# Patient Record
Sex: Female | Born: 1965 | Race: White | Hispanic: No | Marital: Single | Smoking: Never smoker
Health system: Southern US, Community
[De-identification: ages and names within clinical notes are randomized; demographics above are authoritative.]

## PROBLEM LIST (undated history)

## (undated) DIAGNOSIS — F319 Bipolar disorder, unspecified: Secondary | ICD-10-CM

## (undated) DIAGNOSIS — E079 Disorder of thyroid, unspecified: Secondary | ICD-10-CM

## (undated) DIAGNOSIS — E119 Type 2 diabetes mellitus without complications: Secondary | ICD-10-CM

## (undated) DIAGNOSIS — IMO0002 Reserved for concepts with insufficient information to code with codable children: Secondary | ICD-10-CM

## (undated) DIAGNOSIS — R197 Diarrhea, unspecified: Secondary | ICD-10-CM

---

## 2007-08-11 ENCOUNTER — Emergency Department (HOSPITAL_COMMUNITY): Admission: EM | Admit: 2007-08-11 | Discharge: 2007-08-11 | Payer: Self-pay | Admitting: Emergency Medicine

## 2010-10-17 LAB — URINALYSIS, ROUTINE W REFLEX MICROSCOPIC
Nitrite: NEGATIVE
Specific Gravity, Urine: 1.024
Urobilinogen, UA: 0.2
pH: 5.5

## 2010-10-17 LAB — URINE MICROSCOPIC-ADD ON

## 2010-10-17 LAB — PREGNANCY, URINE: Preg Test, Ur: NEGATIVE

## 2013-10-08 ENCOUNTER — Emergency Department (HOSPITAL_BASED_OUTPATIENT_CLINIC_OR_DEPARTMENT_OTHER): Payer: Medicare (Managed Care)

## 2013-10-08 ENCOUNTER — Encounter (HOSPITAL_BASED_OUTPATIENT_CLINIC_OR_DEPARTMENT_OTHER): Payer: Self-pay | Admitting: Emergency Medicine

## 2013-10-08 ENCOUNTER — Emergency Department (HOSPITAL_BASED_OUTPATIENT_CLINIC_OR_DEPARTMENT_OTHER)
Admission: EM | Admit: 2013-10-08 | Discharge: 2013-10-08 | Disposition: A | Discharge status: Home / Self Care | Payer: Medicare (Managed Care) | Attending: Emergency Medicine | Admitting: Emergency Medicine

## 2013-10-08 DIAGNOSIS — F319 Bipolar disorder, unspecified: Secondary | ICD-10-CM | POA: Insufficient documentation

## 2013-10-08 DIAGNOSIS — R1011 Right upper quadrant pain: Secondary | ICD-10-CM | POA: Diagnosis not present

## 2013-10-08 DIAGNOSIS — K5289 Other specified noninfective gastroenteritis and colitis: Secondary | ICD-10-CM | POA: Diagnosis not present

## 2013-10-08 DIAGNOSIS — E079 Disorder of thyroid, unspecified: Secondary | ICD-10-CM | POA: Insufficient documentation

## 2013-10-08 DIAGNOSIS — R112 Nausea with vomiting, unspecified: Secondary | ICD-10-CM | POA: Diagnosis not present

## 2013-10-08 DIAGNOSIS — R42 Dizziness and giddiness: Secondary | ICD-10-CM | POA: Diagnosis not present

## 2013-10-08 DIAGNOSIS — E119 Type 2 diabetes mellitus without complications: Secondary | ICD-10-CM | POA: Insufficient documentation

## 2013-10-08 DIAGNOSIS — Z79899 Other long term (current) drug therapy: Secondary | ICD-10-CM | POA: Diagnosis not present

## 2013-10-08 DIAGNOSIS — K529 Noninfective gastroenteritis and colitis, unspecified: Secondary | ICD-10-CM

## 2013-10-08 DIAGNOSIS — R197 Diarrhea, unspecified: Secondary | ICD-10-CM | POA: Diagnosis present

## 2013-10-08 DIAGNOSIS — Z8669 Personal history of other diseases of the nervous system and sense organs: Secondary | ICD-10-CM | POA: Insufficient documentation

## 2013-10-08 HISTORY — DX: Diarrhea, unspecified: R19.7

## 2013-10-08 HISTORY — DX: Disorder of thyroid, unspecified: E07.9

## 2013-10-08 HISTORY — DX: Bipolar disorder, unspecified: F31.9

## 2013-10-08 HISTORY — DX: Type 2 diabetes mellitus without complications: E11.9

## 2013-10-08 HISTORY — DX: Reserved for concepts with insufficient information to code with codable children: IMO0002

## 2013-10-08 LAB — COMPREHENSIVE METABOLIC PANEL
ALBUMIN: 3.7 g/dL (ref 3.5–5.2)
ALT: 13 U/L (ref 0–35)
AST: 11 U/L (ref 0–37)
Alkaline Phosphatase: 93 U/L (ref 39–117)
Anion gap: 13 (ref 5–15)
BUN: 17 mg/dL (ref 6–23)
CALCIUM: 10 mg/dL (ref 8.4–10.5)
CHLORIDE: 97 meq/L (ref 96–112)
CO2: 24 meq/L (ref 19–32)
Creatinine, Ser: 1.8 mg/dL — ABNORMAL HIGH (ref 0.50–1.10)
GFR calc Af Amer: 37 mL/min — ABNORMAL LOW (ref >90)
GFR, EST NON AFRICAN AMERICAN: 32 mL/min — AB (ref >90)
Glucose, Bld: 124 mg/dL — ABNORMAL HIGH (ref 70–99)
Potassium: 4.1 mEq/L (ref 3.7–5.3)
SODIUM: 134 meq/L — AB (ref 137–147)
Total Bilirubin: 0.4 mg/dL (ref 0.3–1.2)
Total Protein: 6.8 g/dL (ref 6.0–8.3)

## 2013-10-08 LAB — URINALYSIS, ROUTINE W REFLEX MICROSCOPIC
GLUCOSE, UA: NEGATIVE mg/dL
Hgb urine dipstick: NEGATIVE
KETONES UR: 15 mg/dL — AB
NITRITE: NEGATIVE
PH: 5 (ref 5.0–8.0)
Protein, ur: 100 mg/dL — AB
SPECIFIC GRAVITY, URINE: 1.025 (ref 1.005–1.030)
Urobilinogen, UA: 0.2 mg/dL (ref 0.0–1.0)

## 2013-10-08 LAB — CBC WITH DIFFERENTIAL/PLATELET
BASOS ABS: 0 10*3/uL (ref 0.0–0.1)
BASOS PCT: 0 % (ref 0–1)
Eosinophils Absolute: 0.2 10*3/uL (ref 0.0–0.7)
Eosinophils Relative: 2 % (ref 0–5)
HCT: 36.7 % (ref 36.0–46.0)
Hemoglobin: 12 g/dL (ref 12.0–15.0)
LYMPHS PCT: 17 % (ref 12–46)
Lymphs Abs: 1.9 10*3/uL (ref 0.7–4.0)
MCH: 28.7 pg (ref 26.0–34.0)
MCHC: 32.7 g/dL (ref 30.0–36.0)
MCV: 87.8 fL (ref 78.0–100.0)
Monocytes Absolute: 0.7 10*3/uL (ref 0.1–1.0)
Monocytes Relative: 6 % (ref 3–12)
NEUTROS ABS: 8.6 10*3/uL — AB (ref 1.7–7.7)
Neutrophils Relative %: 75 % (ref 43–77)
PLATELETS: 281 10*3/uL (ref 150–400)
RBC: 4.18 MIL/uL (ref 3.87–5.11)
RDW: 13.1 % (ref 11.5–15.5)
WBC: 11.4 10*3/uL — AB (ref 4.0–10.5)

## 2013-10-08 LAB — URINE MICROSCOPIC-ADD ON

## 2013-10-08 LAB — CBG MONITORING, ED: GLUCOSE-CAPILLARY: 115 mg/dL — AB (ref 70–99)

## 2013-10-08 LAB — LIPASE, BLOOD: LIPASE: 20 U/L (ref 11–59)

## 2013-10-08 MED ORDER — SODIUM CHLORIDE 0.9 % IV BOLUS (SEPSIS)
1000.0000 mL | Freq: Once | INTRAVENOUS | Status: AC
  Administered 2013-10-08: 1000 mL via INTRAVENOUS

## 2013-10-08 MED ORDER — METRONIDAZOLE 500 MG PO TABS: 500.0000 mg | ORAL_TABLET | Freq: Two times a day (BID) | ORAL | Status: DC

## 2013-10-08 MED ORDER — CIPROFLOXACIN HCL 500 MG PO TABS: 500.0000 mg | ORAL_TABLET | Freq: Two times a day (BID) | ORAL | Status: DC

## 2013-10-08 MED ORDER — ONDANSETRON 8 MG PO TBDP: ORAL_TABLET | ORAL | Status: DC

## 2013-10-08 MED ORDER — ONDANSETRON HCL 4 MG/2ML IJ SOLN
4.0000 mg | Freq: Once | INTRAMUSCULAR | Status: AC
  Administered 2013-10-08: 4 mg via INTRAVENOUS
  Filled 2013-10-08: qty 2

## 2013-10-08 NOTE — ED Notes (Signed)
Glucose 119 bu acucheck

## 2013-10-08 NOTE — Discharge Instructions (Signed)

## 2013-10-08 NOTE — ED Provider Notes (Signed)
CSN: [id number]     Arrival date & time 10/08/13  1422 History  This chart was scribed for Rolan Bucco, MD by Tonye Royalty, ED Scribe. This patient was seen in room MH09/MH09 and the patient's care was started at 3:10 PM.    Chief Complaint  Patient presents with  . Diarrhea  . Emesis   The history is provided by the patient. No language interpreter was used.   HPI Comments: Heather Owen is a 48 y.o. female who presents to the Emergency Department complaining of intermittent vomiting and watery diarrhea with onset a few years ago; she states she has not seen a doctor for these symptoms since they began. She states symptoms have worsened recently and that she has been experiencing vomiting and/or diarrhea 2-3 times a day for the past 2-3 weeks. She reports associated lightheadedness and nausea. She reports associated inability to keep solids down and weight loss from 212 lb to 189 lb in the past month. She also reports right sided abdominal pain with onset a few months ago. She states symptoms are worse after eating food. She denies blood or green color to vomit. She denies prior significant medical history to her abdomen or abdominal surgeries. She states she does not drink alcohol. She states she has not had any periods in 4-5 years. She reports using OTC diarrhea medication this morning without remission of symptoms. She denies fever, urinary symptoms, CP, SOB, cough, congestion, cold symptoms, or blood in stool.  Past Medical History  Diagnosis Date  . Diarrhea   . Diabetes mellitus without complication   . Thyroid disease     hypo  . Bilateral posterior subcapsular polar cataract   . Bipolar 1 disorder    History reviewed. No pertinent past surgical history. No family history on file. History  Substance Use Topics  . Smoking status: Never Smoker   . Smokeless tobacco: Not on file  . Alcohol Use: No   OB History   Grav Para Term Preterm Abortions TAB SAB Ect Mult Living                  Review of Systems  Constitutional: Positive for unexpected weight change. Negative for fever, chills, diaphoresis and fatigue.  HENT: Negative for congestion, rhinorrhea and sneezing.   Eyes: Negative.   Respiratory: Negative for cough, chest tightness and shortness of breath.   Cardiovascular: Negative for chest pain and leg swelling.  Gastrointestinal: Positive for nausea, vomiting, abdominal pain and diarrhea. Negative for blood in stool.  Genitourinary: Negative for urgency, frequency, hematuria, flank pain, decreased urine volume and difficulty urinating.  Musculoskeletal: Negative for arthralgias and back pain.  Skin: Negative for rash.  Neurological: Positive for light-headedness. Negative for dizziness, speech difficulty, weakness, numbness and headaches.      Allergies  Review of patient's allergies indicates no known allergies.  Home Medications   Prior to Admission medications   Medication Sig Start Date End Date Taking? Authorizing Provider  buPROPion (WELLBUTRIN XL) 150 MG 24 hr tablet Take 150 mg by mouth daily.   Yes Historical Provider, MD  glipiZIDE (GLUCOTROL) 5 MG tablet Take by mouth daily before breakfast.   Yes Historical Provider, MD  hydrochlorothiazide (HYDRODIURIL) 25 MG tablet Take 25 mg by mouth daily.   Yes Historical Provider, MD  levothyroxine (SYNTHROID) 75 MCG tablet Take 75 mcg by mouth daily before breakfast.   Yes Historical Provider, MD  lisinopril (PRINIVIL,ZESTRIL) 5 MG tablet Take 5 mg by mouth daily.  Yes Historical Provider, MD  lithium 300 MG tablet Take 300 mg by mouth 3 (three) times daily.   Yes Historical Provider, MD  loxapine (LOXITANE) 10 MG capsule Take 10 mg by mouth 3 (three) times daily.   Yes Historical Provider, MD  loxapine (LOXITANE) 25 MG capsule Take 25 mg by mouth 3 (three) times daily.   Yes Historical Provider, MD  metFORMIN (GLUCOPHAGE) 1000 MG tablet Take 1,000 mg by mouth 2 (two) times daily with a meal.   Yes  Historical Provider, MD  ciprofloxacin (CIPRO) 500 MG tablet Take 1 tablet (500 mg total) by mouth 2 (two) times daily. One po bid x 7 days 10/08/13   Rolan Bucco, MD  metroNIDAZOLE (FLAGYL) 500 MG tablet Take 1 tablet (500 mg total) by mouth 2 (two) times daily. One po bid x 7 days 10/08/13   Rolan Bucco, MD  ondansetron (ZOFRAN ODT) 8 MG disintegrating tablet  ODT q4 hours prn nausea 10/08/13   Rolan Bucco, MD   BP 100/67  Pulse 74  Temp(Src) 97.7 F (36.5 C)  Resp 18  Ht  (1.626 m)  Wt 189 lb (85.73 kg)  BMI 32.43 kg/m2  SpO2 100% Physical Exam  Constitutional: She is oriented to person, place, and time. She appears well-developed and well-nourished.  HENT:  Head: Normocephalic and atraumatic.  Eyes: Pupils are equal, round, and reactive to light.  Neck: Normal range of motion. Neck supple.  Cardiovascular: Normal rate, regular rhythm and normal heart sounds.   Pulmonary/Chest: Effort normal and breath sounds normal. No respiratory distress. She has no wheezes. She has no rales. She exhibits no tenderness.  Abdominal: Soft. Bowel sounds are normal. There is tenderness (moderate TTP RUQ). There is no rebound and no guarding.  Musculoskeletal: Normal range of motion. She exhibits no edema.  Lymphadenopathy:    She has no cervical adenopathy.  Neurological: She is alert and oriented to person, place, and time.  Skin: Skin is warm and dry. No rash noted.  Psychiatric: She has a normal mood and affect.    ED Course  Procedures (including critical care time) Labs Review Results for orders placed during the hospital encounter of 10/08/13  CBC WITH DIFFERENTIAL      Result Value Ref Range   WBC 11.4 (*) 4.0 - 10.5 K/uL   RBC 4.18  3.87 - 5.11 MIL/uL   Hemoglobin 12.0  12.0 - 15.0 g/dL   HCT 16.1  09.6 - 04.5 %   MCV 87.8  78.0 - 100.0 fL   MCH 28.7  26.0 - 34.0 pg   MCHC 32.7  30.0 - 36.0 g/dL   RDW 40.9  81.1 - 91.4 %   Platelets 281  150 - 400 K/uL   Neutrophils  Relative % 75  43 - 77 %   Neutro Abs 8.6 (*) 1.7 - 7.7 K/uL   Lymphocytes Relative 17  12 - 46 %   Lymphs Abs 1.9  0.7 - 4.0 K/uL   Monocytes Relative 6  3 - 12 %   Monocytes Absolute 0.7  0.1 - 1.0 K/uL   Eosinophils Relative 2  0 - 5 %   Eosinophils Absolute 0.2  0.0 - 0.7 K/uL   Basophils Relative 0  0 - 1 %   Basophils Absolute 0.0  0.0 - 0.1 K/uL  COMPREHENSIVE METABOLIC PANEL      Result Value Ref Range   Sodium 134 (*) 137 - 147 mEq/L   Potassium 4.1  3.7 -  5.3 mEq/L   Chloride 97  96 - 112 mEq/L   CO2 24  19 - 32 mEq/L   Glucose, Bld 124 (*) 70 - 99 mg/dL   BUN 17  6 - 23 mg/dL   Creatinine, Ser 8.29 (*) 0.50 - 1.10 mg/dL   Calcium 56.2  8.4 - 13.0 mg/dL   Total Protein 6.8  6.0 - 8.3 g/dL   Albumin 3.7  3.5 - 5.2 g/dL   AST 11  0 - 37 U/L   ALT 13  0 - 35 U/L   Alkaline Phosphatase 93  39 - 117 U/L   Total Bilirubin 0.4  0.3 - 1.2 mg/dL   GFR calc non Af Amer 32 (*) >90 mL/min   GFR calc Af Amer 37 (*) >90 mL/min   Anion gap 13  5 - 15  LIPASE, BLOOD      Result Value Ref Range   Lipase 20  11 - 59 U/L  URINALYSIS, ROUTINE W REFLEX MICROSCOPIC      Result Value Ref Range   Color, Urine AMBER (*) YELLOW   APPearance TURBID (*) CLEAR   Specific Gravity, Urine 1.025  1.005 - 1.030   pH 5.0  5.0 - 8.0   Glucose, UA NEGATIVE  NEGATIVE mg/dL   Hgb urine dipstick NEGATIVE  NEGATIVE   Bilirubin Urine SMALL (*) NEGATIVE   Ketones, ur 15 (*) NEGATIVE mg/dL   Protein, ur 865 (*) NEGATIVE mg/dL   Urobilinogen, UA 0.2  0.0 - 1.0 mg/dL   Nitrite NEGATIVE  NEGATIVE   Leukocytes, UA LARGE (*) NEGATIVE  URINE MICROSCOPIC-ADD ON      Result Value Ref Range   Squamous Epithelial / LPF MANY (*) RARE   WBC, UA 21-50  <3 WBC/hpf   Bacteria, UA MANY (*) RARE   Casts HYALINE CASTS (*) NEGATIVE   Urine-Other RARE YEAST    CBG MONITORING, ED      Result Value Ref Range   Glucose-Capillary 115 (*) 70 - 99 mg/dL   Comment 1 Notify RN     Comment 2 Documented in Chart     US  Abdomen Complete  10/08/2013   CLINICAL DATA:  Right upper quadrant abdominal pain for years. Diarrhea.  EXAM: ULTRASOUND ABDOMEN COMPLETE  COMPARISON:  None.  FINDINGS: Gallbladder:  12 mm echogenic focus in the gallbladder neck, which does not move. This is likely a non shadowing stone. No wall thickening. No pericholecystic fluid. No evidence of acute cholecystitis.  Common bile duct:  Diameter: 3.8 mm  Liver:  Liver is echogenic with decreased through transmission of the sound beam. No liver mass or focal lesion. Hepatopetal flow documented in the portal vein.  IVC:  Not well visualized.  Pancreas:  Visualized portion unremarkable.  Spleen:  Size and appearance within normal limits.  Right Kidney:  Length: 12.4 cm. Mild dilation of the right renal pelvis versus a renal sinus cyst. No cava seal dilation. No renal masses. Normal parenchymal echogenicity.  Left Kidney:  Length: 12.4 cm. Cystic areas noted along the renal sinus. These are likely renal sinus cysts. There may be mild hydronephrosis. No renal mass or stone. Normal parenchymal echogenicity.  Abdominal aorta:  No aneurysm visualized.  Other findings:  None.  IMPRESSION: 1. Mild dilation of the right renal pelvis versus a renal sinus cysts. Other cystic areas project in the left renal sinus which may reflect renal sinus cysts, mild hydronephrosis or a combination. 2. Hepatic steatosis. 3. 12 mm non  shadowing gallstone.  No acute cholecystitis. 4. No other abnormalities.   Electronically Signed   By: Amie Portland M.D.   On: 10/08/2013 16:21     Imaging Review US Abdomen Complete  10/08/2013   CLINICAL DATA:  Right upper quadrant abdominal pain for years. Diarrhea.  EXAM: ULTRASOUND ABDOMEN COMPLETE  COMPARISON:  None.  FINDINGS: Gallbladder:  12 mm echogenic focus in the gallbladder neck, which does not move. This is likely a non shadowing stone. No wall thickening. No pericholecystic fluid. No evidence of acute cholecystitis.  Common bile duct:   Diameter: 3.8 mm  Liver:  Liver is echogenic with decreased through transmission of the sound beam. No liver mass or focal lesion. Hepatopetal flow documented in the portal vein.  IVC:  Not well visualized.  Pancreas:  Visualized portion unremarkable.  Spleen:  Size and appearance within normal limits.  Right Kidney:  Length: 12.4 cm. Mild dilation of the right renal pelvis versus a renal sinus cyst. No cava seal dilation. No renal masses. Normal parenchymal echogenicity.  Left Kidney:  Length: 12.4 cm. Cystic areas noted along the renal sinus. These are likely renal sinus cysts. There may be mild hydronephrosis. No renal mass or stone. Normal parenchymal echogenicity.  Abdominal aorta:  No aneurysm visualized.  Other findings:  None.  IMPRESSION: 1. Mild dilation of the right renal pelvis versus a renal sinus cysts. Other cystic areas project in the left renal sinus which may reflect renal sinus cysts, mild hydronephrosis or a combination. 2. Hepatic steatosis. 3. 12 mm non shadowing gallstone.  No acute cholecystitis. 4. No other abnormalities.   Electronically Signed   By: Amie Portland M.D.   On: 10/08/2013 16:21   Ct Renal Stone Study  10/08/2013   CLINICAL DATA:  Right flank pain.  Diabetes.  Renal insufficiency.  EXAM: CT ABDOMEN AND PELVIS WITHOUT CONTRAST  TECHNIQUE: Multidetector CT imaging of the abdomen and pelvis was performed following the standard protocol without IV contrast.  COMPARISON:  10/08/2013  FINDINGS: Lower chest: Faint dependent density in the right lower lobe, likely due to mild atelectasis.  Hepatobiliary: Mild diffuse hepatic steatosis. 0.7 cm gallstone in the gallbladder.  Spleen: Unremarkable  Pancreas: Unremarkable  Stomach/Bowel: Mild distal sigmoid and rectal wall thickening. Appendix normal. A 2 mm in diameter locule of gas along the border of the junction of the third and fourth portions of the duodenum on image 41 of series 3 presumably is in a tiny duodenal diverticulum or  ulcer. It seems unlikely that this is extraluminal or within mesenteric vasculature given the lack of other surrounding abnormal findings. I have canvassed the abdomen, pelvis, and liver looking for other similar findings without success.  Adrenals/urinary tract: Bilateral peripelvic cysts, larger on the left than the right. No observed hydronephrosis or hydroureter. No stones identified.  Vascular/Lymphatic: Unremarkable  Reproductive: Unremarkable  Musculoskeletal: Mild sclerosis along the iliac side of the left sacroiliac joint. Mild spurring along both greater trochanters, partially chronically fragmented. 10 mm of anterolisthesis at L5-S1 with suspected left unilateral pars defect and resulting bilateral foraminal stenosis at L5-S1.  IMPRESSION:  1. Abnormal wall thickening in the distal sigmoid colon and rectum compatible with colitis. This could be a distal infectious colitis or due to inflammatory bowel disease. Equivocal wall thickening in the terminal ileum. 2. Tiny locular gas along the margin of the junction of the third and part fourth portions of the duodenum. Differential diagnostic considerations include diverticulum or ulceration. Given the lack of other  secondary surrounding findings are strongly doubt that this is in the mesenteric vasculature or a true extraluminal locula gas. This measures 2 mm in diameter. 3. Cholelithiasis. 4. Mild hepatic steatosis. 5. Mild sclerosis along the iliac side left sacroiliac joint compatible with unilateral sacroiliitis. 6. 10 mm of anterolisthesis at L5-S1 due to a combination of degenerative arthropathy and left unilateral pars defect, with resulting bilateral foraminal impingement at L5-S1.   Electronically Signed   By: Herbie Baltimore M.D.   On: 10/08/2013 18:19     EKG Interpretation None     DIAGNOSTIC STUDIES: Oxygen Saturation is 97% on room air, normal by my interpretation.    COORDINATION OF CARE: 3:17 PM Discussed treatment plan including  ultrasound of gallbladder and nausea medication with pt at bedside and pt agreed to plan.    MDM   Final diagnoses:  Colitis   Pt with ongoing n/v/d for several years.  Worse recently.  Evidence of colitis and gallstone without cholecystitis.  Will start cipro/flagyl.  Advised to f/u with GI, given referral.  Advised to f/u with her PMD regarding elevated creatinine.  Pt feeling much better after fluids, zofran.  Tolerating PO fluids.   I personally performed the services described in this documentation, which was scribed in my presence.  The recorded information has been reviewed and considered.     Rolan Bucco, MD 10/09/13 [phone]

## 2013-10-08 NOTE — ED Notes (Signed)
Per caregiver, patient has been experiencing chronic N/V/D over the past year, this week it has grown worse, able to drink fluids & eat

## 2013-10-09 NOTE — Progress Notes (Signed)
CM received a phone call requesting ED prescription for Zofran to be changed to phenergan for affordability. [facility] pharmacist, Marchelle Folks, stated that the patient's insurance declined the Zofran and the patient is unable to afford the prescription. This CM spoke with Jaynie Crumble ED PA and received a verbal order for phenergan  1 tab po every 6 to 8 hours as needed for nausea. This CM contacted [facility] pharmacist, and gave new verbal order with read back for clarification. No other questions or concerns at this time. Ferdinand Cava, RN, BSN, Case Manager 10/09/2013 10:41 AM

## 2015-05-14 ENCOUNTER — Other Ambulatory Visit: Payer: Self-pay | Admitting: *Deleted

## 2015-05-14 ENCOUNTER — Other Ambulatory Visit: Payer: Self-pay

## 2015-05-14 ENCOUNTER — Other Ambulatory Visit: Payer: Self-pay | Admitting: Nurse Practitioner

## 2015-05-14 DIAGNOSIS — Z1231 Encounter for screening mammogram for malignant neoplasm of breast: Secondary | ICD-10-CM

## 2015-05-22 ENCOUNTER — Ambulatory Visit (INDEPENDENT_AMBULATORY_CARE_PROVIDER_SITE_OTHER): Payer: Medicare (Managed Care)

## 2015-05-22 DIAGNOSIS — R928 Other abnormal and inconclusive findings on diagnostic imaging of breast: Secondary | ICD-10-CM

## 2015-05-22 DIAGNOSIS — Z1231 Encounter for screening mammogram for malignant neoplasm of breast: Secondary | ICD-10-CM

## 2015-05-24 ENCOUNTER — Other Ambulatory Visit: Payer: Self-pay | Admitting: Nurse Practitioner

## 2015-05-24 DIAGNOSIS — R928 Other abnormal and inconclusive findings on diagnostic imaging of breast: Secondary | ICD-10-CM

## 2015-05-31 ENCOUNTER — Ambulatory Visit
Admission: RE | Admit: 2015-05-31 | Discharge: 2015-05-31 | Disposition: A | Discharge status: Home / Self Care | Payer: Medicare (Managed Care) | Source: Ambulatory Visit | Attending: Nurse Practitioner | Admitting: Nurse Practitioner

## 2015-05-31 ENCOUNTER — Other Ambulatory Visit: Payer: Self-pay | Admitting: Nurse Practitioner

## 2015-05-31 DIAGNOSIS — R928 Other abnormal and inconclusive findings on diagnostic imaging of breast: Secondary | ICD-10-CM

## 2016-02-27 ENCOUNTER — Other Ambulatory Visit: Payer: Self-pay | Admitting: Family Medicine

## 2016-02-27 DIAGNOSIS — N63 Unspecified lump in unspecified breast: Secondary | ICD-10-CM

## 2016-04-22 ENCOUNTER — Other Ambulatory Visit: Payer: Medicare (Managed Care)

## 2016-04-24 ENCOUNTER — Ambulatory Visit
Admission: RE | Admit: 2016-04-24 | Discharge: 2016-04-24 | Disposition: A | Discharge status: Home / Self Care | Payer: Medicare (Managed Care) | Source: Ambulatory Visit | Attending: Family Medicine | Admitting: Family Medicine

## 2016-04-24 DIAGNOSIS — N63 Unspecified lump in unspecified breast: Secondary | ICD-10-CM

## 2016-04-28 ENCOUNTER — Other Ambulatory Visit: Payer: Medicare (Managed Care)

## 2016-07-20 ENCOUNTER — Other Ambulatory Visit: Payer: Self-pay | Admitting: Family Medicine

## 2016-07-20 DIAGNOSIS — N6320 Unspecified lump in the left breast, unspecified quadrant: Secondary | ICD-10-CM

## 2016-07-23 ENCOUNTER — Ambulatory Visit
Admission: RE | Admit: 2016-07-23 | Discharge: 2016-07-23 | Disposition: A | Discharge status: Home / Self Care | Payer: Medicare (Managed Care) | Source: Ambulatory Visit | Attending: Family Medicine | Admitting: Family Medicine

## 2016-07-23 ENCOUNTER — Other Ambulatory Visit: Payer: Self-pay | Admitting: Physician Assistant

## 2016-07-23 ENCOUNTER — Other Ambulatory Visit: Payer: Self-pay | Admitting: Family Medicine

## 2016-07-23 DIAGNOSIS — N6320 Unspecified lump in the left breast, unspecified quadrant: Secondary | ICD-10-CM

## 2017-02-21 IMAGING — US US BREAST 10 MINUTES
1 series · 4 of 4 positions shown · non-contrast
Comparison: Baseline exam 05/22/2015

CLINICAL DATA: Patient recalled from screening for bilateral
asymmetries.

EXAM:
2D DIGITAL DIAGNOSTIC BILATERAL MAMMOGRAM WITH CAD AND ADJUNCT TOMO
ULTRASOUND LEFT BREAST

[Series 1: us breast 10 minutes · 0.04mm/px · 4 of 4 slices shown]
[im 1/4]
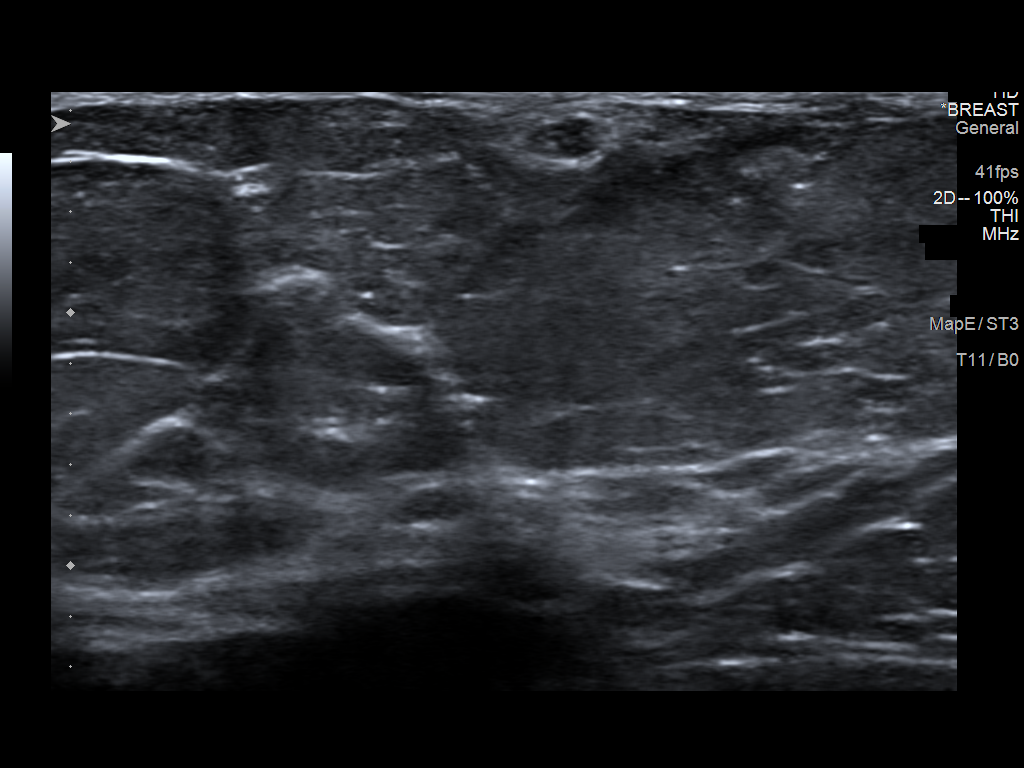
[im 2/4]
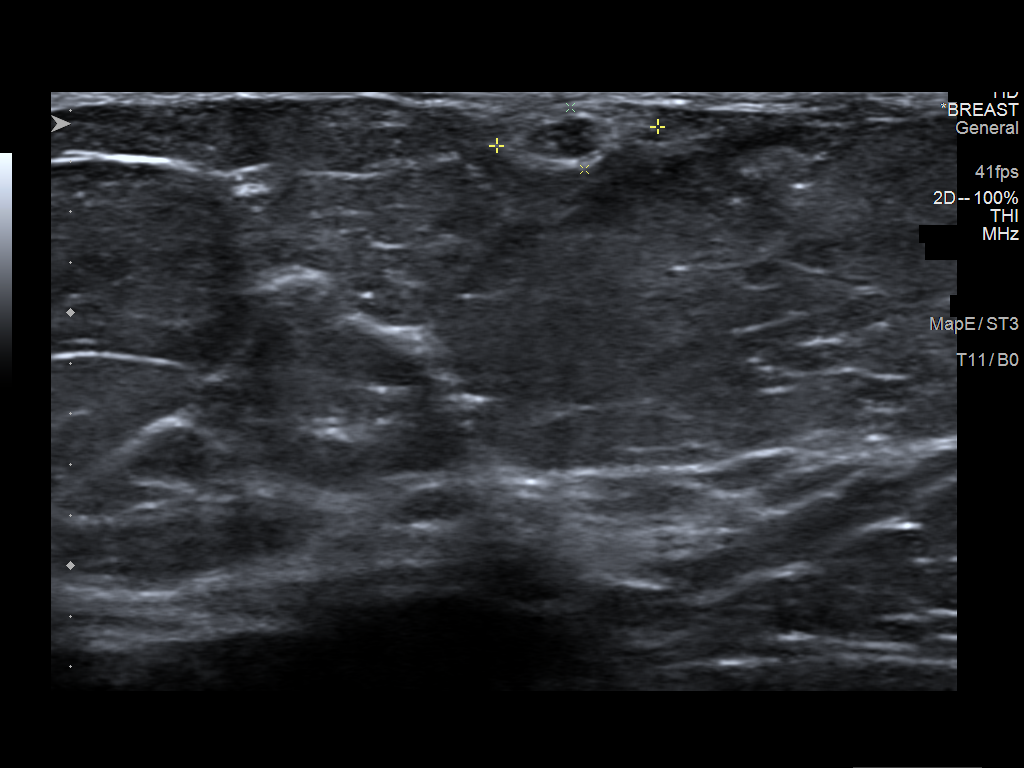
[im 3/4]
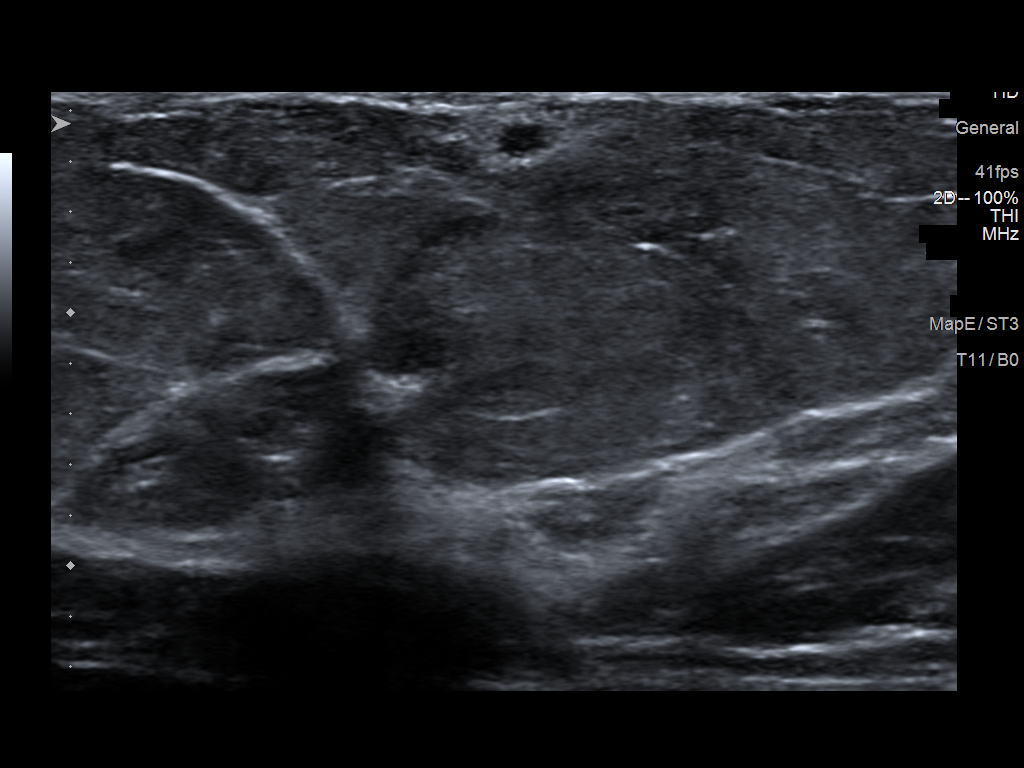
[im 4/4]
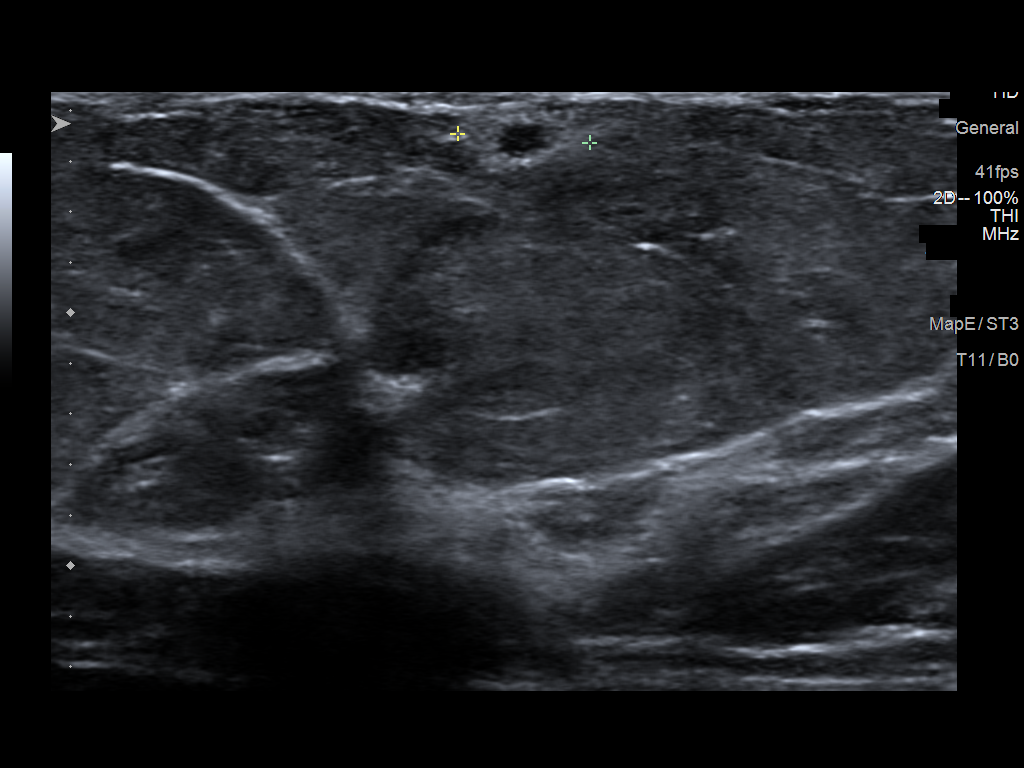

[4 of 4 positions shown; findings below may reference images not displayed]

ACR Breast Density Category b: There are scattered areas of
fibroglandular density.
FINDINGS: CC and MLO tomosynthesis images of the right and left breast were
obtained. The questioned asymmetry within the right breast on the
MLO view resolved compatible with overlapping fibroglandular breast
tissue. The questioned asymmetry within the left breast in the MLO
view resolved compatible with overlapping fibroglandular breast
tissue. Additionally within the upper-outer left breast there is a
low-density oval circumscribed 6 mm mass.

Mammographic images were processed with CAD.

On physical exam, I palpate no discrete mass within the upper-outer
left breast.

Targeted ultrasound is performed, showing a 6 x 3 x 5 mm oval
circumscribed hypoechoic mass with internal echogenicity within the
left breast 2 o'clock position 4 cm from nipple.
IMPRESSION: Left breast mass, potentially representing a complicated cyst.

RECOMMENDATION:
Left breast diagnostic mammography and ultrasound in 6 months to
demonstrate stability of probably benign left breast mass.

I have discussed the findings and recommendations with the patient.
Results were also provided in writing at the conclusion of the
visit. If applicable, a reminder letter will be sent to the patient
regarding the next appointment.

BI-RADS CATEGORY  3: Probably benign.

## 2017-06-24 ENCOUNTER — Other Ambulatory Visit: Payer: Self-pay | Admitting: Family Medicine

## 2017-06-24 DIAGNOSIS — Z1231 Encounter for screening mammogram for malignant neoplasm of breast: Secondary | ICD-10-CM

## 2017-08-06 ENCOUNTER — Ambulatory Visit (INDEPENDENT_AMBULATORY_CARE_PROVIDER_SITE_OTHER): Payer: Medicare Other

## 2017-08-06 DIAGNOSIS — Z1231 Encounter for screening mammogram for malignant neoplasm of breast: Secondary | ICD-10-CM

## 2018-09-28 ENCOUNTER — Other Ambulatory Visit: Payer: Self-pay | Admitting: Family Medicine

## 2018-09-28 DIAGNOSIS — Z1231 Encounter for screening mammogram for malignant neoplasm of breast: Secondary | ICD-10-CM

## 2018-10-27 ENCOUNTER — Ambulatory Visit (INDEPENDENT_AMBULATORY_CARE_PROVIDER_SITE_OTHER): Payer: Medicare Other

## 2018-10-27 ENCOUNTER — Other Ambulatory Visit: Payer: Self-pay

## 2018-10-27 DIAGNOSIS — Z1231 Encounter for screening mammogram for malignant neoplasm of breast: Secondary | ICD-10-CM | POA: Diagnosis not present

## 2019-12-18 ENCOUNTER — Encounter: Payer: Self-pay | Admitting: Emergency Medicine

## 2019-12-18 ENCOUNTER — Emergency Department (INDEPENDENT_AMBULATORY_CARE_PROVIDER_SITE_OTHER): Payer: Medicare Other

## 2019-12-18 ENCOUNTER — Emergency Department (INDEPENDENT_AMBULATORY_CARE_PROVIDER_SITE_OTHER)
Admission: EM | Admit: 2019-12-18 | Discharge: 2019-12-18 | Disposition: A | Discharge status: Home / Self Care | Payer: Medicare Other | Source: Home / Self Care

## 2019-12-18 ENCOUNTER — Other Ambulatory Visit: Payer: Self-pay

## 2019-12-18 DIAGNOSIS — E860 Dehydration: Secondary | ICD-10-CM

## 2019-12-18 DIAGNOSIS — I82432 Acute embolism and thrombosis of left popliteal vein: Secondary | ICD-10-CM

## 2019-12-18 DIAGNOSIS — I825Z2 Chronic embolism and thrombosis of unspecified deep veins of left distal lower extremity: Secondary | ICD-10-CM | POA: Diagnosis not present

## 2019-12-18 LAB — POCT CBC W AUTO DIFF (K'VILLE URGENT CARE)

## 2019-12-18 NOTE — ED Provider Notes (Signed)
[facility]    CSN: [id number] Arrival date & time: 12/18/19  1211      History   Chief Complaint Chief Complaint  Patient presents with  . DVT    HPI Heather Owen is a 54 y.o. female.   HPI  Heather Owen is a 54 y.o. female presenting to UC with family member with c/o intermittent posterior Left knee pain for the last 2 days.  She was dx with COVID on 10/31/19, admitted for 11 days starting on 11/01/19. Dx with an extensive Left LE DVT, started on Xarelto.  She has since been to the ED on 11/22/19 for dehydration from vomiting and diarrhea.  She f/u with PCP for COVID pneumonia and DVT on 11/24/19. Was advised to continue to take the Xarelto and to monitor her symptoms.  Pain is concerning for pt today but states it is mild.  Family member concerned pt is dehydrated again despite increased fluid intake. Pt denies chest pain, SOB, HA or dizziness.   Past Medical History:  Diagnosis Date  . Bilateral posterior subcapsular polar cataract   . Bipolar 1 disorder (HCC)   . Diabetes mellitus without complication (HCC)   . Diarrhea   . Thyroid disease    hypo    There are no problems to display for this patient.   History reviewed. No pertinent surgical history.  OB History   No obstetric history on file.      Home Medications    Prior to Admission medications   Medication Sig Start Date End Date Taking? Authorizing Provider  buPROPion (WELLBUTRIN XL) 150 MG 24 hr tablet Take 150 mg by mouth daily.    [provider]  lithium 300 MG tablet Take 300 mg by mouth 3 (three) times daily.    [provider]  loxapine (LOXITANE) 10 MG capsule Take 10 mg by mouth 3 (three) times daily.    [provider]  loxapine (LOXITANE) 25 MG capsule Take 25 mg by mouth 3 (three) times daily.    [provider]  metFORMIN (GLUCOPHAGE) 1000 MG tablet Take 1,000 mg by mouth 2 (two) times daily with a meal.    [provider]     Family History Family History  Problem Relation Age of Onset  . Breast cancer Maternal Grandmother   . Diabetes Mother   . Diabetes Father   . Heart disease Father     Social History Social History   Tobacco Use  . Smoking status: Never Smoker  . Smokeless tobacco: Never Used  Vaping Use  . Vaping Use: Never used  Substance Use Topics  . Alcohol use: No  . Drug use: No     Allergies   Patient has no known allergies.   Review of Systems Review of Systems  Constitutional: Negative for chills and fever.  HENT: Negative for congestion, ear pain, sore throat, trouble swallowing and voice change.   Respiratory: Negative for cough and shortness of breath.   Cardiovascular: Negative for chest pain and palpitations.  Gastrointestinal: Negative for abdominal pain, diarrhea, nausea and vomiting.  Musculoskeletal: Positive for joint swelling and myalgias. Negative for arthralgias and back pain.  Skin: Negative for rash.  All other systems reviewed and are negative.    Physical Exam Triage Vital Signs ED Triage Vitals  Enc Vitals Group     BP 12/18/19 1231 122/85     Pulse Rate 12/18/19 1231 (!) 105     Resp --  Temp 12/18/19 1231 98.3 F (36.8 C)     Temp Source 12/18/19 1231 Oral     SpO2 12/18/19 1231 99 %     Weight 12/18/19 1232 176 lb (79.8 kg)     Height 12/18/19 1232 5\' 2"  (1.575 m)     Head Circumference --      Peak Flow --      Pain Score 12/18/19 1231 7     Pain Loc --      Pain Edu? --      Excl. in GC? --    No data found.  Updated Vital Signs BP 122/85 (BP Location: Right Arm)   Pulse (!) 105   Temp 98.3 F (36.8 C) (Oral)   Ht 5\' 2"  (1.575 m)   Wt 176 lb (79.8 kg)   SpO2 99%   BMI 32.19 kg/m   Visual Acuity Right Eye Distance:   Left Eye Distance:   Bilateral Distance:    Right Eye Near:   Left Eye Near:    Bilateral Near:     Physical Exam Vitals and nursing note reviewed.  Constitutional:      Appearance: Normal  appearance. She is well-developed. She is obese.  HENT:     Head: Normocephalic and atraumatic.     Right Ear: Tympanic membrane and ear canal normal.     Left Ear: Tympanic membrane and ear canal normal.     Nose: Nose normal.     Mouth/Throat:     Mouth: Mucous membranes are dry.     Pharynx: Oropharynx is clear.  Eyes:     Extraocular Movements: Extraocular movements intact.     Conjunctiva/sclera: Conjunctivae normal.     Pupils: Pupils are equal, round, and reactive to light.  Cardiovascular:     Rate and Rhythm: Normal rate and regular rhythm.     Pulses:          Dorsalis pedis pulses are 2+ on the left side.     Comments: Mild tachycardia in triage, regular rate and rhythm on exam. Pulmonary:     Effort: Pulmonary effort is normal. No respiratory distress.     Breath sounds: Normal breath sounds.  Musculoskeletal:        General: Tenderness (Left posterior knee: mild) present. Normal range of motion.     Cervical back: Normal range of motion. No rigidity.     Right lower leg: No edema.     Left lower leg: Edema ( diffuse) present.  Skin:    General: Skin is warm and dry.     Capillary Refill: Capillary refill takes less than 2 seconds.     Findings: No bruising or erythema.  Neurological:     General: No focal deficit present.     Mental Status: She is alert and oriented to person, place, and time.     Sensory: No sensory deficit.  Psychiatric:        Behavior: Behavior normal.      UC Treatments / Results  Labs (all labs ordered are listed, but only abnormal results are displayed) Labs Reviewed  COMPLETE METABOLIC PANEL WITH GFR  POCT CBC W AUTO DIFF ([facility])    EKG   Radiology 12/20/19 Venous Img Lower Unilateral Left  Result Date: 12/18/2019 CLINICAL DATA:  Left lower leg pain and swelling, known DVT EXAM: LEFT LOWER EXTREMITY VENOUS DOPPLER ULTRASOUND TECHNIQUE: Gray-scale sonography with compression, as well as color and duplex ultrasound,  were performed to evaluate the  deep venous system(s) from the level of the common femoral vein through the popliteal and proximal calf veins. COMPARISON:  None. FINDINGS: VENOUS Abnormal compressibility of the common femoral, superficial femoral, and popliteal veins, as well as the visualized portions of profunda femoral vein and saphenofemoral junction. Corresponding abnormal spectral doppler findings. There is nonocclusive thrombus within the popliteal vein with preserved compressibility. Calf veins are not visualized due to edema. Contralateral right common femoral vein is patent. OTHER None. Limitations: none IMPRESSION: Positive for left femoropopliteal DVT. These results were called by telephone at the time of interpretation on 12/18/2019 at 2:14 pm to provider [facility] , who verbally acknowledged these results. Electronically Signed   By: Guadlupe Spanish M.D.   On: 12/18/2019 14:14    Procedures Procedures (including critical care time)  Medications Ordered in UC Medications - No data to display  Initial Impression / Assessment and Plan / UC Course  I have reviewed the triage vital signs and the nursing notes.  Pertinent labs & imaging results that were available during my care of the patient were reviewed by me and considered in my medical decision making (see chart for details).     Discussed [place] with pt and family member, reassured it appears DVT size has improved since initial dx on 11/08/19.  Discussed pt with Dr. Leonides Grills, pt safe for discharge home, continue Xarelto and close f/u with PCP as she may continue to have pain from initial extensive DVT CBC and CMP ordered per request of pt and family member due to concerns for dehydration.  Discussed symptoms that warrant emergent care in the ED. AVS given   Final Clinical Impressions(s) / UC Diagnoses   Final diagnoses:  Lower leg DVT (deep venous thromboembolism), chronic, left (HCC)  Mild dehydration     Discharge  Instructions      The blood clot in your left leg appears to have improved since initial ultrasound of your leg on 11/08/19.  Please continue to take your Xarelto as prescribed.  Call to schedule a follow up appointment with your primary care provider this week for recheck of symptoms and pain.   Call 911 or have someone drive you to the hospital if symptoms significantly worsening including worsening pain, change in skin color- red, purple, black; if you develop chest pain, trouble breathing, or any other new concerning symptoms develop.     ED Prescriptions    None     I have reviewed the PDMP during this encounter.   Lurene Shadow, PA-C 12/18/19 1459

## 2019-12-18 NOTE — Discharge Instructions (Signed)
  The blood clot in your left leg appears to have improved since initial ultrasound of your leg on 11/08/19.  Please continue to take your Xarelto as prescribed.  Call to schedule a follow up appointment with your primary care provider this week for recheck of symptoms and pain.   Call 911 or have someone drive you to the hospital if symptoms significantly worsening including worsening pain, change in skin color- red, purple, black; if you develop chest pain, trouble breathing, or any other new concerning symptoms develop.

## 2019-12-18 NOTE — ED Triage Notes (Signed)
Patient had Covid 1.5 months ago has DVT in left calf, she feels like it has moved.Taking Governor Rooks

## 2019-12-19 LAB — COMPLETE METABOLIC PANEL WITH GFR
AG Ratio: 1.5 (calc) (ref 1.0–2.5)
ALT: 18 U/L (ref 6–29)
AST: 16 U/L (ref 10–35)
Albumin: 3.1 g/dL — ABNORMAL LOW (ref 3.6–5.1)
Alkaline phosphatase (APISO): 40 U/L (ref 37–153)
BUN/Creatinine Ratio: 6 (calc) (ref 6–22)
BUN: 10 mg/dL (ref 7–25)
CO2: 23 mmol/L (ref 20–32)
Calcium: 8.6 mg/dL (ref 8.6–10.4)
Chloride: 112 mmol/L — ABNORMAL HIGH (ref 98–110)
Creat: 1.56 mg/dL — ABNORMAL HIGH (ref 0.50–1.05)
GFR, Est African American: 43 mL/min/{1.73_m2} — ABNORMAL LOW (ref >=60)
GFR, Est Non African American: 37 mL/min/{1.73_m2} — ABNORMAL LOW (ref >=60)
Globulin: 2.1 g/dL (calc) (ref 1.9–3.7)
Glucose, Bld: 111 mg/dL — ABNORMAL HIGH (ref 65–99)
Potassium: 4.6 mmol/L (ref 3.5–5.3)
Sodium: 142 mmol/L (ref 135–146)
Total Bilirubin: 0.3 mg/dL (ref 0.2–1.2)
Total Protein: 5.2 g/dL — ABNORMAL LOW (ref 6.1–8.1)

## 2020-04-24 ENCOUNTER — Inpatient Hospital Stay
Admission: RE | Admit: 2020-04-24 | Discharge: 2020-04-24 | Disposition: A | Discharge status: Home / Self Care | Payer: Self-pay | Source: Ambulatory Visit

## 2020-04-24 ENCOUNTER — Emergency Department (INDEPENDENT_AMBULATORY_CARE_PROVIDER_SITE_OTHER): Payer: Medicare Other

## 2020-04-24 ENCOUNTER — Emergency Department (INDEPENDENT_AMBULATORY_CARE_PROVIDER_SITE_OTHER)
Admission: EM | Admit: 2020-04-24 | Discharge: 2020-04-24 | Disposition: A | Discharge status: Home / Self Care | Payer: Medicare Other | Source: Home / Self Care | Attending: Family Medicine | Admitting: Family Medicine

## 2020-04-24 ENCOUNTER — Other Ambulatory Visit: Payer: Self-pay

## 2020-04-24 DIAGNOSIS — R2242 Localized swelling, mass and lump, left lower limb: Secondary | ICD-10-CM | POA: Diagnosis not present

## 2020-04-24 DIAGNOSIS — Z8679 Personal history of other diseases of the circulatory system: Secondary | ICD-10-CM | POA: Diagnosis not present

## 2020-04-24 DIAGNOSIS — M25562 Pain in left knee: Secondary | ICD-10-CM | POA: Diagnosis not present

## 2020-04-24 DIAGNOSIS — S8392XA Sprain of unspecified site of left knee, initial encounter: Secondary | ICD-10-CM

## 2020-04-24 DIAGNOSIS — W19XXXA Unspecified fall, initial encounter: Secondary | ICD-10-CM

## 2020-04-24 DIAGNOSIS — M79662 Pain in left lower leg: Secondary | ICD-10-CM

## 2020-04-24 NOTE — Discharge Instructions (Signed)
Wear knee brace.  Apply ice pack for 20 to 30 minutes, 3 to 4 times daily  Continue until pain and swelling decrease.  May take Tylenol as needed for pain.

## 2020-04-24 NOTE — ED Provider Notes (Signed)
[facility]    CSN: [id number] Arrival date & time: 04/24/20  1258      History   Chief Complaint Chief Complaint  Patient presents with  . Leg Pain    Left    HPI Heather Owen is a 55 y.o. female.   Patient fell six days ago, twisting her left knee.  She has developed increased pain/swelling in the knee with swelling in her left lower leg.  She had a left DVT last year, presently taking Xarelto, and is concerned that she may have developed another DVT.  She denies chest pain and shortness of breath.  The history is provided by the patient.  Knee Pain Location:  Knee Time since incident:  6 days Injury: yes   Mechanism of injury: fall   Knee location:  L knee Pain details:    Quality:  Aching   Radiates to:  Does not radiate   Severity:  Mild   Onset quality:  Gradual   Duration:  6 weeks   Timing:  Constant   Progression:  Worsening Chronicity:  New Prior injury to area:  No Relieved by:  Nothing Worsened by:  Bearing weight and flexion Ineffective treatments:  None tried Associated symptoms: decreased ROM and swelling   Associated symptoms: no fatigue, no fever, no muscle weakness, no numbness and no tingling     Past Medical History:  Diagnosis Date  . Bilateral posterior subcapsular polar cataract   . Bipolar 1 disorder (HCC)   . Diabetes mellitus without complication (HCC)   . Diarrhea   . Thyroid disease    hypo    There are no problems to display for this patient.   History reviewed. No pertinent surgical history.  OB History   No obstetric history on file.      Home Medications    Prior to Admission medications   Medication Sig Start Date End Date Taking? Authorizing Provider  rivaroxaban (XARELTO) 10 MG TABS tablet Take 10 mg by mouth daily.   Yes [provider]  buPROPion (WELLBUTRIN XL) 150 MG 24 hr tablet Take 150 mg by mouth daily.    [provider]  lithium 300 MG tablet Take 300 mg by mouth 3  (three) times daily.    [provider]  loxapine (LOXITANE) 10 MG capsule Take 10 mg by mouth 3 (three) times daily.    [provider]  loxapine (LOXITANE) 25 MG capsule Take 25 mg by mouth 3 (three) times daily.    [provider]  metFORMIN (GLUCOPHAGE) 1000 MG tablet Take 1,000 mg by mouth 2 (two) times daily with a meal.    [provider]    Family History Family History  Problem Relation Age of Onset  . Breast cancer Maternal Grandmother   . Diabetes Mother   . Diabetes Father   . Heart disease Father     Social History Social History   Tobacco Use  . Smoking status: Never Smoker  . Smokeless tobacco: Never Used  Vaping Use  . Vaping Use: Never used  Substance Use Topics  . Alcohol use: No  . Drug use: No     Allergies   Patient has no known allergies.   Review of Systems Review of Systems  Constitutional: Positive for activity change. Negative for chills, diaphoresis, fatigue and fever.  Respiratory: Negative for cough, chest tightness, shortness of breath and wheezing.   Cardiovascular: Negative for chest pain.  Musculoskeletal: Positive for joint swelling.  Skin: Negative for color change.  All other systems reviewed and are negative.    Physical Exam Triage Vital Signs ED Triage Vitals  Enc Vitals Group     BP 04/24/20 1318 124/84     Pulse Rate 04/24/20 1318 90     Resp 04/24/20 1318 18     Temp 04/24/20 1318 98.3 F (36.8 C)     Temp Source 04/24/20 1318 Oral     SpO2 04/24/20 1318 98 %     Weight --      Height --      Head Circumference --      Peak Flow --      Pain Score 04/24/20 1316 8     Pain Loc --      Pain Edu? --      Excl. in GC? --    No data found.  Updated Vital Signs BP 124/84 (BP Location: Left Arm)   Pulse 90   Temp 98.3 F (36.8 C) (Oral)   Resp 18   SpO2 98%   Visual Acuity Right Eye Distance:   Left Eye Distance:   Bilateral Distance:    Right Eye Near:   Left Eye  Near:    Bilateral Near:     Physical Exam Vitals and nursing note reviewed.  Constitutional:      General: She is not in acute distress. HENT:     Head: Atraumatic.     Mouth/Throat:     Pharynx: Oropharynx is clear.  Eyes:     Conjunctiva/sclera: Conjunctivae normal.     Pupils: Pupils are equal, round, and reactive to light.  Cardiovascular:     Rate and Rhythm: Normal rate and regular rhythm.     Heart sounds: Normal heart sounds.  Pulmonary:     Breath sounds: Normal breath sounds.  Musculoskeletal:     Left knee: Swelling present. No deformity. Decreased range of motion.     Left lower leg: Edema present.       Legs:     Comments: Left lower leg has diffuse non-pitting edema.  There is no erythema or warmth and the areas noted on diagram are mildly tender to palpation.  Left knee difficult to examine because of swelling but there does not appear to be ligamentous laxity.  Distal pulses intact.  Lymphadenopathy:     Cervical: No cervical adenopathy.  Skin:    General: Skin is warm and dry.     Findings: No erythema.  Neurological:     General: No focal deficit present.     Mental Status: She is alert.      UC Treatments / Results  Labs (all labs ordered are listed, but only abnormal results are displayed) Labs Reviewed - No data to display  EKG   Radiology US Venous Img Lower Unilateral Left  Result Date: 04/24/2020 CLINICAL DATA:  Increasing left lower leg pain and swelling after falling 6 days ago and injuring the left knee. History of DVT. EXAM: LEFT LOWER EXTREMITY VENOUS DOPPLER ULTRASOUND TECHNIQUE: Gray-scale sonography with compression, as well as color and duplex ultrasound, were performed to evaluate the deep venous system(s) from the level of the common femoral vein through the popliteal and proximal calf veins. COMPARISON:  Prior study 12/18/2019 FINDINGS: VENOUS Normal compressibility of the common femoral, superficial femoral, and popliteal veins,  as well as the visualized calf veins. Visualized portions of profunda femoral vein and great saphenous vein unremarkable. No filling defects to suggest DVT on  grayscale or color Doppler imaging. Doppler waveforms show normal direction of venous flow, normal respiratory plasticity and response to augmentation. Limited views of the contralateral common femoral vein are unremarkable. OTHER None. Limitations: none IMPRESSION: No evidence of recurrent left lower extremity DVT. Electronically Signed   By: Carey Bullocks M.D.   On: 04/24/2020 14:28   DG Knee Complete 4 Views Left  Result Date: 04/24/2020 CLINICAL DATA:  Left knee pain since falling 6 days ago. History of DVT. EXAM: LEFT KNEE - COMPLETE 4+ VIEW COMPARISON:  None. FINDINGS: The mineralization and alignment are normal. There is no evidence of acute fracture or dislocation. There are mild tricompartmental degenerative changes and a moderate size knee joint effusion. Soft tissue calcifications are present within the proximal lower leg medially. No evidence of foreign body. IMPRESSION: Moderate size knee joint effusion without evidence of acute fracture or dislocation. Mild tricompartmental degenerative changes. Electronically Signed   By: Carey Bullocks M.D.   On: 04/24/2020 14:49    Procedures Procedures (including critical care time)  Medications Ordered in UC Medications - No data to display  Initial Impression / Assessment and Plan / UC Course  I have reviewed the triage vital signs and the nursing notes.  Pertinent labs & imaging results that were available during my care of the patient were reviewed by me and considered in my medical decision making (see chart for details).    No evidence left leg DVT.  Swelling in left lower leg a result of knee sprain. Dispensed hinged knee brace. Given sprain treatment instructions with range of motion and stretching exercises.  Followup with Dr[facility] if  not improving about one week.   Final Clinical Impressions(s) / UC Diagnoses   Final diagnoses:  Sprain of left knee, unspecified ligament, initial encounter     Discharge Instructions     Wear knee brace.  Apply ice pack for 20 to 30 minutes, 3 to 4 times daily  Continue until pain and swelling decrease.  May take Tylenol as needed for pain.    ED Prescriptions    None        Lattie Haw, MD 04/26/20 1012

## 2020-04-24 NOTE — ED Triage Notes (Signed)
Patient presents to Urgent Care with complaints of left leg pain from her knee down since she fell 6 days ago. Patient reports she had a DVT last year and was put on xarelto. Has not missed doses of it but is concerned the swelling in her leg could be another blood clot. Non-pitting edema noted on left lower leg.

## 2022-01-16 IMAGING — US US EXTREM LOW VENOUS*L*
1 series · 14 of 24 positions shown · non-contrast
Comparison: Prior study 12/18/2019

CLINICAL DATA: Increasing left lower leg pain and swelling after
falling 6 days ago and injuring the left knee. History of DVT.

EXAM:
LEFT LOWER EXTREMITY VENOUS DOPPLER ULTRASOUND
TECHNIQUE: Gray-scale sonography with compression, as well as color and duplex
ultrasound, were performed to evaluate the deep venous system(s)
from the level of the common femoral vein through the popliteal and
proximal calf veins.

[Series 1: us venous img lower uni left (dvt) · portal-venous · 14 of 40 slices shown]
[im 1/40]
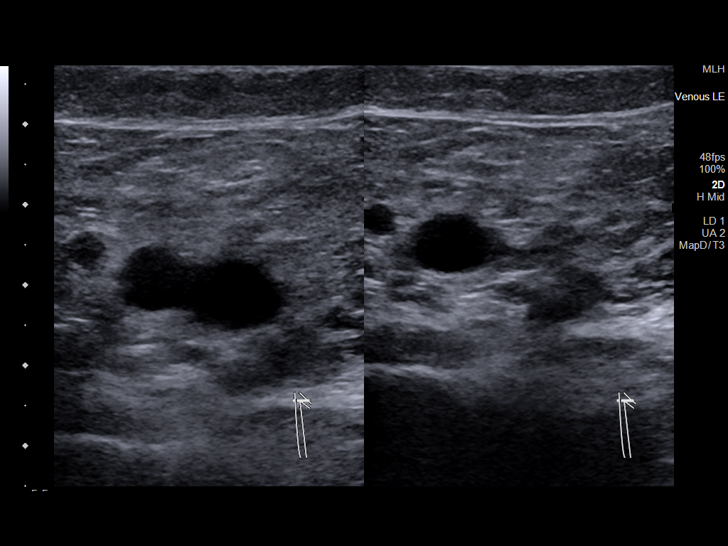
[im 4/40]
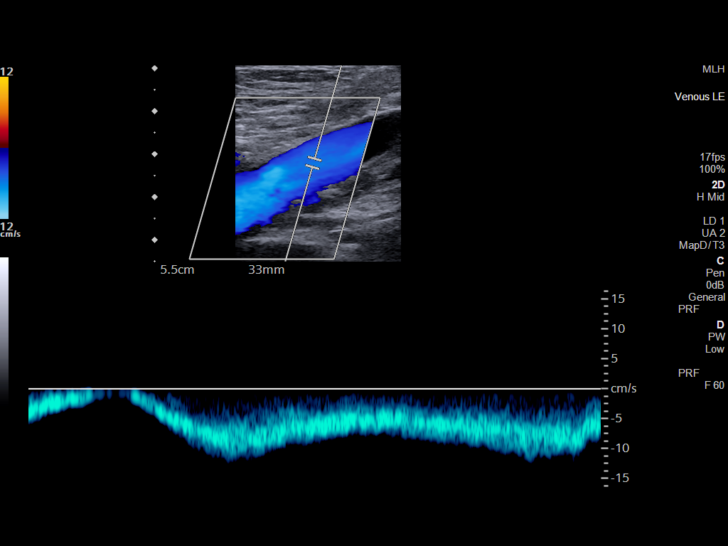
[im 7/40]
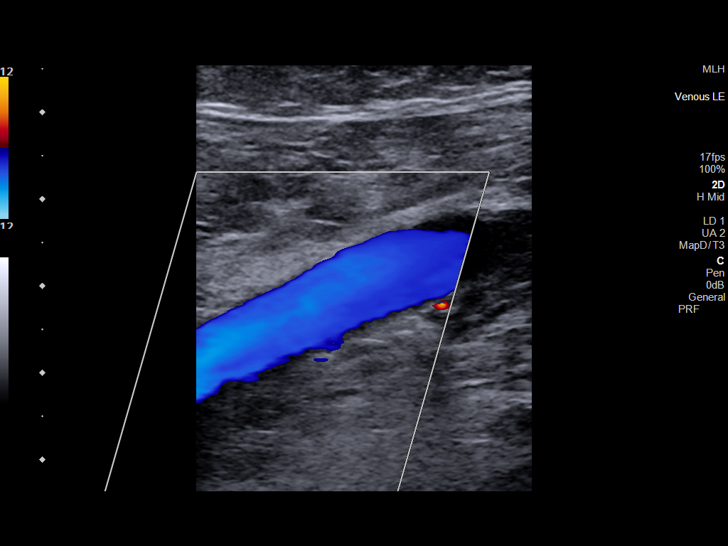
[im 11/40]
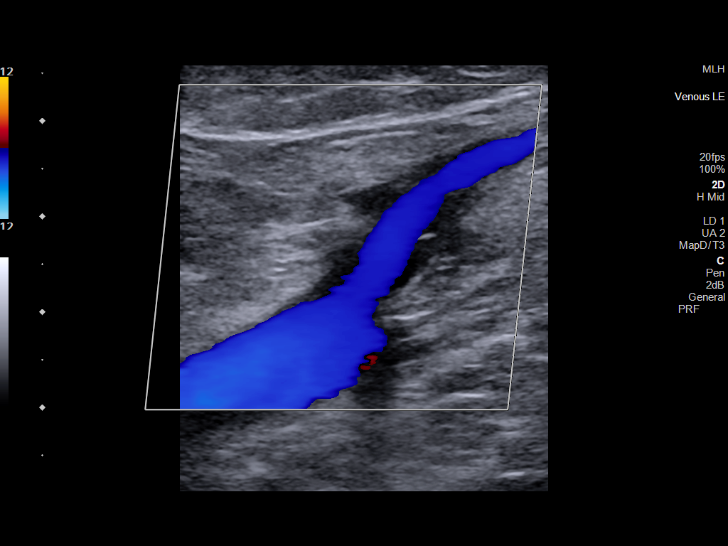
[im 12/40]
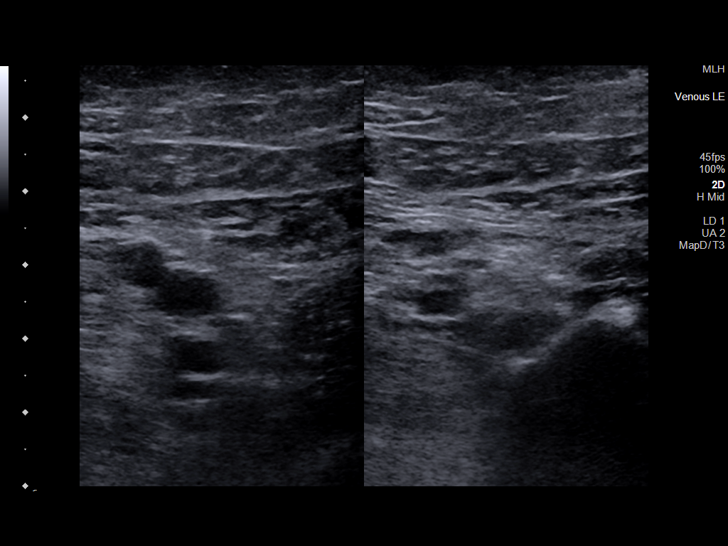
[im 16/40]
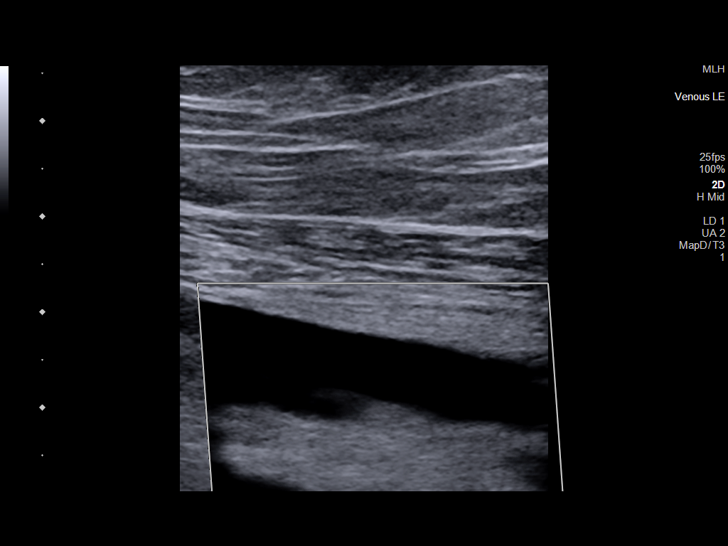
[im 19/40]
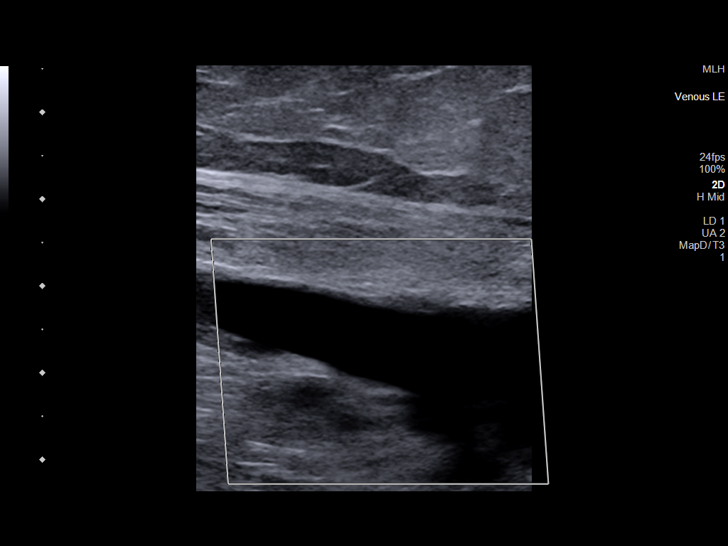
[im 21/40]
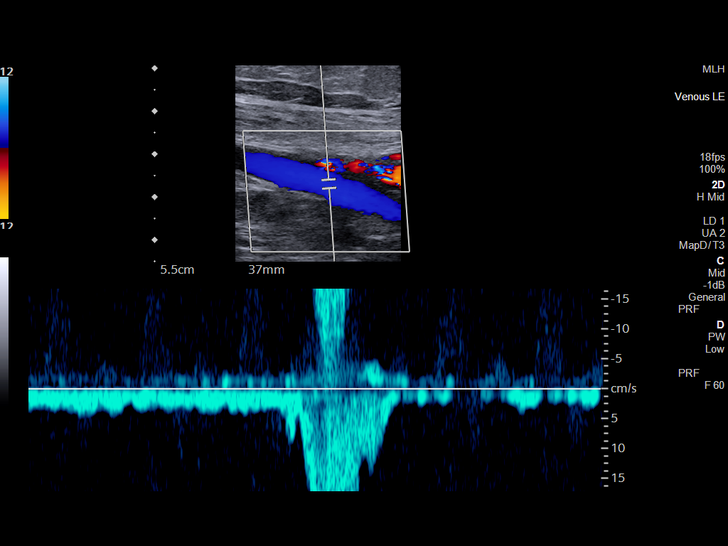
[im 24/40]
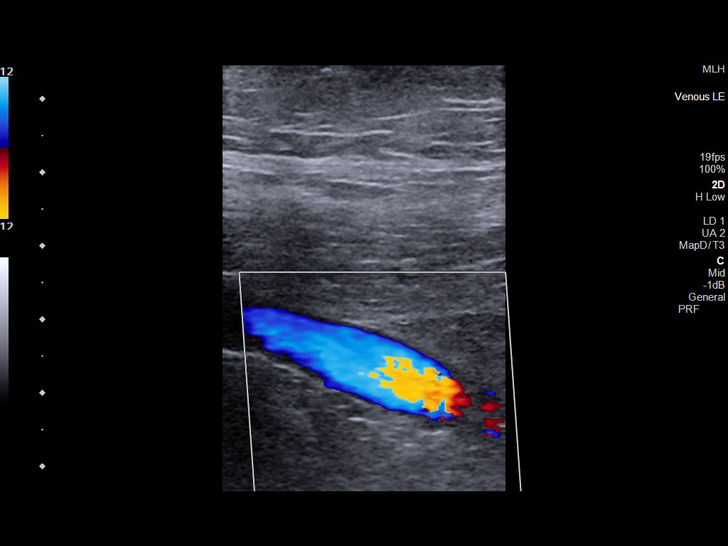
[im 28/40]
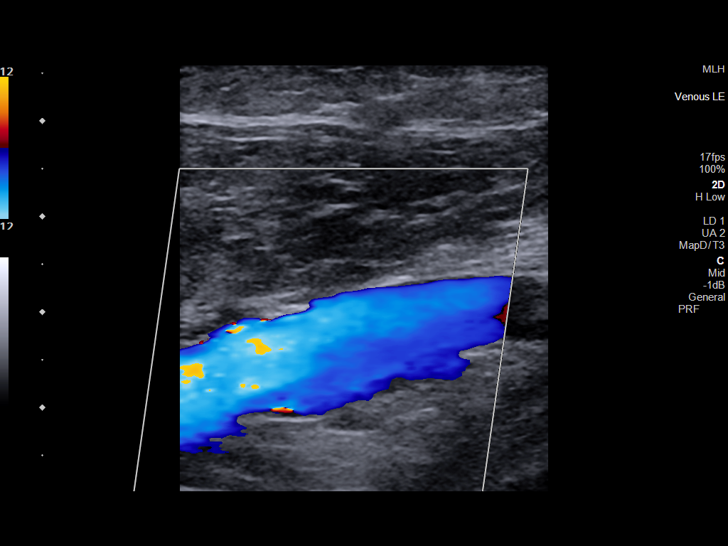
[im 31/40]
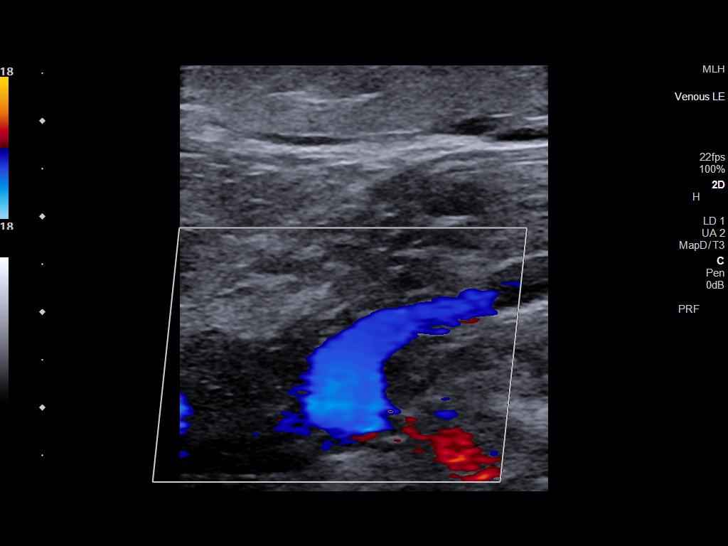
[im 33/40]
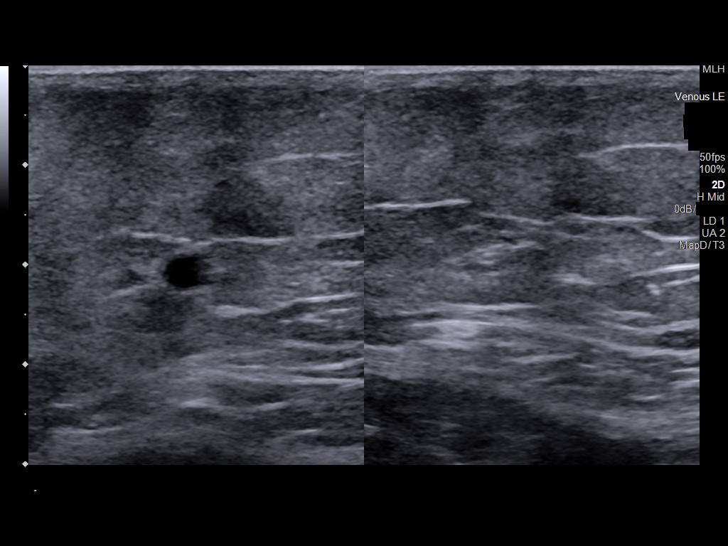
[im 36/40]
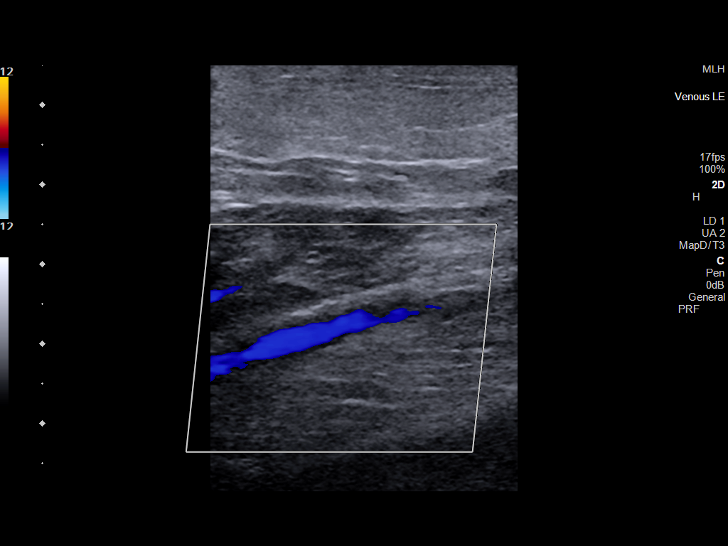
[im 40/40]
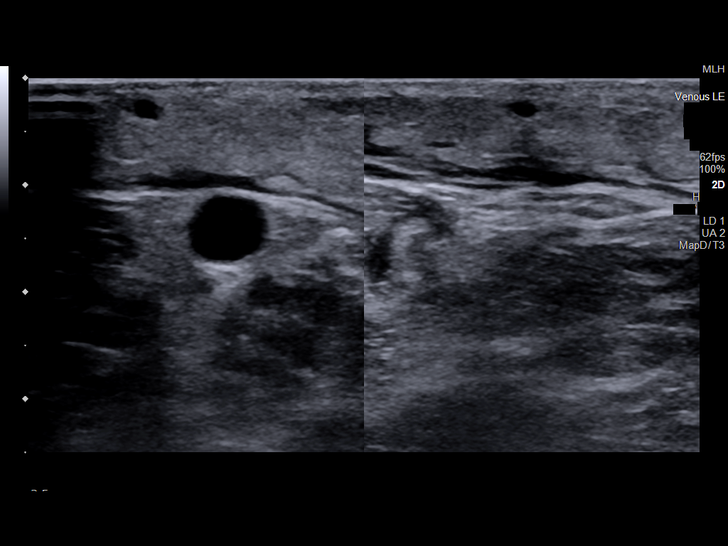

[14 of 24 positions shown; findings below may reference images not displayed]

FINDINGS: VENOUS

Normal compressibility of the common femoral, superficial femoral,
and popliteal veins, as well as the visualized calf veins.
Visualized portions of profunda femoral vein and great saphenous
vein unremarkable. No filling defects to suggest DVT on grayscale or
color Doppler imaging. Doppler waveforms show normal direction of
venous flow, normal respiratory plasticity and response to
augmentation.

Limited views of the contralateral common femoral vein are
unremarkable.

OTHER

None.

Limitations: none
IMPRESSION: No evidence of recurrent left lower extremity DVT.

## 2022-03-25 ENCOUNTER — Other Ambulatory Visit (HOSPITAL_COMMUNITY): Payer: Self-pay | Admitting: Family Medicine

## 2022-03-25 DIAGNOSIS — N6489 Other specified disorders of breast: Secondary | ICD-10-CM
